# Patient Record
Sex: Female | Born: 2000 | Race: Black or African American | Hispanic: No | Marital: Single | Smoking: Former smoker
Health system: Southern US, Community
[De-identification: ages and names within clinical notes are randomized; demographics above are authoritative.]

---
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
## 2018-03-11 ENCOUNTER — Encounter (HOSPITAL_COMMUNITY): Payer: Self-pay | Admitting: *Deleted

## 2018-03-11 ENCOUNTER — Inpatient Hospital Stay (HOSPITAL_COMMUNITY)
Admission: AD | Admit: 2018-03-11 | Discharge: 2018-03-12 | Disposition: A | Discharge status: Home / Self Care | Payer: Medicaid Other | Source: Ambulatory Visit | Attending: Family Medicine | Admitting: Family Medicine

## 2018-03-11 DIAGNOSIS — Z5321 Procedure and treatment not carried out due to patient leaving prior to being seen by health care provider: Secondary | ICD-10-CM | POA: Insufficient documentation
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
## 2018-03-12 DIAGNOSIS — Z5321 Procedure and treatment not carried out due to patient leaving prior to being seen by health care provider: Secondary | ICD-10-CM | POA: Diagnosis not present

## 2018-03-12 NOTE — MAU Note (Signed)
NOT IN LOBBY 

## 2018-04-18 ENCOUNTER — Emergency Department (HOSPITAL_BASED_OUTPATIENT_CLINIC_OR_DEPARTMENT_OTHER)
Admission: EM | Admit: 2018-04-18 | Discharge: 2018-04-18 | Disposition: A | Discharge status: Home / Self Care | Payer: Medicaid Other | Attending: Emergency Medicine | Admitting: Emergency Medicine

## 2018-04-18 ENCOUNTER — Other Ambulatory Visit: Payer: Self-pay

## 2018-04-18 ENCOUNTER — Emergency Department (HOSPITAL_BASED_OUTPATIENT_CLINIC_OR_DEPARTMENT_OTHER): Payer: Medicaid Other

## 2018-04-18 ENCOUNTER — Encounter (HOSPITAL_BASED_OUTPATIENT_CLINIC_OR_DEPARTMENT_OTHER): Payer: Self-pay | Admitting: *Deleted

## 2018-04-18 DIAGNOSIS — S29019A Strain of muscle and tendon of unspecified wall of thorax, initial encounter: Secondary | ICD-10-CM | POA: Insufficient documentation

## 2018-04-18 DIAGNOSIS — Y939 Activity, unspecified: Secondary | ICD-10-CM | POA: Diagnosis not present

## 2018-04-18 DIAGNOSIS — Y999 Unspecified external cause status: Secondary | ICD-10-CM | POA: Diagnosis not present

## 2018-04-18 DIAGNOSIS — M25512 Pain in left shoulder: Secondary | ICD-10-CM | POA: Diagnosis not present

## 2018-04-18 DIAGNOSIS — F17200 Nicotine dependence, unspecified, uncomplicated: Secondary | ICD-10-CM | POA: Diagnosis not present

## 2018-04-18 DIAGNOSIS — S161XXA Strain of muscle, fascia and tendon at neck level, initial encounter: Secondary | ICD-10-CM

## 2018-04-18 DIAGNOSIS — Y929 Unspecified place or not applicable: Secondary | ICD-10-CM | POA: Insufficient documentation

## 2018-04-18 DIAGNOSIS — S199XXA Unspecified injury of neck, initial encounter: Secondary | ICD-10-CM | POA: Diagnosis present

## 2018-04-18 LAB — PREGNANCY, URINE: PREG TEST UR: NEGATIVE

## 2018-04-18 MED ORDER — IBUPROFEN 800 MG PO TABS: 800.0000 mg | ORAL_TABLET | Freq: Three times a day (TID) | ORAL | 0 refills | Status: AC | PRN

## 2018-04-18 NOTE — Discharge Instructions (Signed)
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
## 2021-05-10 ENCOUNTER — Ambulatory Visit
Admission: EM | Admit: 2021-05-10 | Discharge: 2021-05-10 | Disposition: A | Discharge status: Home / Self Care | Payer: Medicaid Other | Attending: Family Medicine | Admitting: Family Medicine

## 2021-05-10 ENCOUNTER — Ambulatory Visit: Payer: Self-pay

## 2021-05-10 ENCOUNTER — Other Ambulatory Visit: Payer: Self-pay

## 2021-05-10 DIAGNOSIS — R519 Headache, unspecified: Secondary | ICD-10-CM

## 2021-05-10 DIAGNOSIS — N926 Irregular menstruation, unspecified: Secondary | ICD-10-CM | POA: Diagnosis not present

## 2021-05-10 LAB — POCT URINE PREGNANCY: Preg Test, Ur: NEGATIVE

## 2021-05-10 MED ORDER — DEXAMETHASONE SODIUM PHOSPHATE 10 MG/ML IJ SOLN
10.0000 mg | Freq: Once | INTRAMUSCULAR | Status: AC
  Administered 2021-05-10: 10 mg via INTRAMUSCULAR

## 2021-05-10 MED ORDER — KETOROLAC TROMETHAMINE 30 MG/ML IJ SOLN
30.0000 mg | Freq: Once | INTRAMUSCULAR | Status: AC
  Administered 2021-05-10: 30 mg via INTRAMUSCULAR

## 2021-05-10 NOTE — ED Triage Notes (Signed)
Pt c/o headaches since Monday, states no OTC mediations help.  Pt reports not having a menstrual cycle for 41 days. Does not use any form of birth control, requesting to be tested for pregnancy. Pt also reports having lower back pain.

## 2021-05-10 NOTE — ED Provider Notes (Signed)
[facility]    CSN: [id number] Arrival date & time: 05/10/21  1122      History   Chief Complaint Chief Complaint  Patient presents with   Headache   Possible Pregnancy    HPI Katie Orozco is a 20 y.o. female.   Patient presenting today with a diffuse headache x2 days that has been constant.  She denies associated dizziness, vision changes, nausea, vomiting, congestion, sore throat, fever, chills, mental status changes, extremity weakness, numbness, tingling, recent head injury.  She has tried Excedrin Migraine and Tylenol with no relief.  Denies any history of headache disorders, migraines.  No new routine changes, new stressors, diet or medication changes.  She is also requesting a pregnancy test as her period is about 2 weeks late.  She has had unprotected intercourse and does not use any sort of contraception.  No vaginal bleeding, discharge or other GU symptoms.   History reviewed. No pertinent past medical history.  Patient Active Problem List   Diagnosis Date Noted   UTI (lower urinary tract infection) 11/29/2015    History reviewed. No pertinent surgical history.  OB History     Gravida  0   Para  0   Term  0   Preterm  0   AB  0   Living  0      SAB  0   IAB  0   Ectopic  0   Multiple  0   Live Births               Home Medications    Prior to Admission medications   Medication Sig Start Date End Date Taking? Authorizing Provider  ibuprofen (ADVIL,MOTRIN) 800 MG tablet Take 1 tablet (800 mg total) by mouth every 8 (eight) hours as needed. 04/18/18   Long, Arlyss Repress, MD  metroNIDAZOLE (FLAGYL) 500 MG tablet Take 1 tablet (500 mg total) by mouth 2 (two) times daily. 01/28/16   Earley Favor, NP  norgestimate-ethinyl estradiol (ORTHO-CYCLEN,SPRINTEC,PREVIFEM) 0.25-35 MG-MCG tablet Take 1 tablet by mouth daily. 11/25/15   Leftwich-Kirby, Wilmer Floor, CNM  sulfamethoxazole-trimethoprim (BACTRIM DS,SEPTRA DS) 800-160 MG tablet Take 1 tablet by  mouth 2 (two) times daily. 11/29/15   Aviva Signs, CNM    Family History History reviewed. No pertinent family history.  Social History Social History   Tobacco Use   Smoking status: Former    Types: Cigarettes   Smokeless tobacco: Never  Substance Use Topics   Alcohol use: Yes    Comment: socially   Drug use: No     Allergies   Patient has no known allergies.   Review of Systems Review of Systems Per HPI  Physical Exam Triage Vital Signs ED Triage Vitals  Enc Vitals Group     BP 05/10/21 1144 113/77     Pulse Rate 05/10/21 1144 83     Resp 05/10/21 1144 18     Temp 05/10/21 1144 98.4 F (36.9 C)     Temp Source 05/10/21 1144 Oral     SpO2 05/10/21 1144 97 %     Weight --      Height --      Head Circumference --      Peak Flow --      Pain Score 05/10/21 1142 8     Pain Loc --      Pain Edu? --      Excl. in GC? --    No data found.  Updated Vital  Signs BP 113/77 (BP Location: Left Arm)   Pulse 83   Temp 98.4 F (36.9 C) (Oral)   Resp 18   LMP  (LMP Unknown)   SpO2 97%   Visual Acuity Right Eye Distance:   Left Eye Distance:   Bilateral Distance:    Right Eye Near:   Left Eye Near:    Bilateral Near:     Physical Exam Vitals and nursing note reviewed.  Constitutional:      Appearance: Normal appearance. She is not ill-appearing.  HENT:     Head: Atraumatic.     Right Ear: Tympanic membrane normal.     Left Ear: Tympanic membrane normal.     Nose: Nose normal.     Mouth/Throat:     Mouth: Mucous membranes are moist.     Pharynx: Oropharynx is clear.  Eyes:     Extraocular Movements: Extraocular movements intact.     Conjunctiva/sclera: Conjunctivae normal.  Cardiovascular:     Rate and Rhythm: Normal rate and regular rhythm.     Heart sounds: Normal heart sounds.  Pulmonary:     Effort: Pulmonary effort is normal.     Breath sounds: Normal breath sounds.  Musculoskeletal:        General: Normal range of motion.      Cervical back: Normal range of motion and neck supple.  Skin:    General: Skin is warm and dry.  Neurological:     General: No focal deficit present.     Mental Status: She is alert and oriented to person, place, and time.     Motor: No weakness.     Gait: Gait normal.  Psychiatric:        Mood and Affect: Mood normal.        Thought Content: Thought content normal.        Judgment: Judgment normal.     UC Treatments / Results  Labs (all labs ordered are listed, but only abnormal results are displayed) Labs Reviewed  POCT URINE PREGNANCY    EKG   Radiology No results found.  Procedures Procedures (including critical care time)  Medications Ordered in UC Medications  ketorolac (TORADOL) 30 MG/ML injection 30 mg (30 mg Intramuscular Given 05/10/21 1245)  dexamethasone (DECADRON) injection 10 mg (10 mg Intramuscular Given 05/10/21 1247)    Initial Impression / Assessment and Plan / UC Course  I have reviewed the triage vital signs and the nursing notes.  Pertinent labs & imaging results that were available during my care of the patient were reviewed by me and considered in my medical decision making (see chart for details).     Vitals and exam benign and reassuring today, no focal neurologic deficits and in no acute distress.  Unclear etiology of her headache but will trial Toradol, Decadron and supportive home care.  Close follow-up with PCP recommended, return for worsening symptoms at any time.  Work note given for rest.  Urine pregnancy negative, follow-up with GYN for further evaluation and management of irregular menstrual cycles.  Final Clinical Impressions(s) / UC Diagnoses   Final diagnoses:  Bad headache  Missed period   Discharge Instructions   None    ED Prescriptions   None    PDMP not reviewed this encounter.   Particia Nearing, [place] 05/10/21 [phone]

## 2022-10-22 ENCOUNTER — Encounter: Payer: Self-pay | Admitting: Registered"

## 2022-10-22 ENCOUNTER — Encounter: Payer: Medicaid Other | Attending: Nurse Practitioner | Admitting: Registered"

## 2022-10-22 DIAGNOSIS — E282 Polycystic ovarian syndrome: Secondary | ICD-10-CM | POA: Insufficient documentation

## 2022-10-22 NOTE — Progress Notes (Signed)
Medical Nutrition Therapy  Appointment Start time:  [phone]  Appointment End time:  1015  Primary concerns today: cannot take Metformin, open to education how this visit can help her situation.  Referral diagnosis: PCOS Preferred learning style: no preference indicated Learning readiness: ready  NUTRITION ASSESSMENT  Anthropometrics  Not assessed this visit   Clinical Medical Hx: UTI, PCOS last Sept dx, dairy allergy Medications: reviewed Labs: A1c 5.3% Notable Signs/Symptoms: painful periods, hirsutism, irrgular periods, low energy, stress 5/10.  Lifestyle & Dietary Hx Pt reports although she was recently diagnosed with PCOS, believes she has had it for awhile. Pt reports her periods were unbearable, hirsutism on face and breasts, irrgular periods since ~22 yrs old. Pt reports her energy has decreased over the last year 3/10 and had to reduce from full-time work to part-time, works 6 am-12 or 2 pm. Pt states not enjoying her current job and wants to work in Therapist, art for [employer]. Pt states she is in school for computer science in December, on-line.    Pt states only diet she has tried is plant-based diet for a month, no meat. Pt states did not change her diet much except replaced meat with vegetarian substitutes.   Pt states she doesn't get much sun exposure and she has a weekly high-dose vitamin D Rx but has a hard time remembering to take it. .   Pt states started metformin last year and discontinued because diarrhea did not resolve. Pt states she started spironolactone, but discontinued due to low blood pressure. Pt reports sxs of feeling dizzy and vision affected -seeing stars & blurry vision.  Estimated daily fluid intake: 24 oz Supplements:probiotics, hair & nail vitamin Sleep: 10:30 pm - 5:30 a.m. naps 4-6 pm Stress / self-care: 5/10; nails, hair, shopping. 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  As evidenced by stated gain of new knowldege.  NUTRITION INTERVENTION  Nutrition education (E-1) on the following topics:  PCOS pathophysiology Insulin role in energy  Insulin resistance / elevated insulin levels Foods to help with inflammation Supplements: Vitamin D, inositol  Handouts Provided Include  Inositol and PCOS MyPlate planner ([place])  Learning Style & Readiness for Change Teaching method utilized: Visual & Auditory  Demonstrated degree of understanding via: Teach Back  Barriers to learning/adherence to lifestyle change: none  Goals Established by Pt Spearmint tea Sleep: Try changing your nap from 4-6 to 4-5; Go to bed at 10 pm (work toward 9:30). Alert on health app to take vitamin D weekly. Consider eating seafood start with twice a week, 3 times if desired. Pay attention to when you start craving exercise. Inositol supplement may be able to help insulin sensitivity  MONITORING & EVALUATION Dietary intake, weekly physical activity, and PCOS sxs in 6 weeks.

## 2022-10-22 NOTE — Patient Instructions (Addendum)
Spearmint tea  Sleep: Try changing your nap from 4-6 to 4-5; Go to bed at 10 pm (work toward 9:30).  Alert on health app to take vitamin D weekly.  Consider eating seafood start with twice a week, 3 times if desired.  Pay attention to when you start craving exercise.  Inositol supplement may be able to help insulin sensitivity

## 2022-12-17 ENCOUNTER — Encounter: Payer: Medicaid Other | Attending: Nurse Practitioner | Admitting: Registered"

## 2022-12-17 DIAGNOSIS — E282 Polycystic ovarian syndrome: Secondary | ICD-10-CM | POA: Insufficient documentation

## 2022-12-17 NOTE — Patient Instructions (Addendum)
Stop taking Fenugreek - it may increase testosterone levels. Start diluting your juice to help prevent potential sugar spikes and drops (low blood sugar). It may also be contributing to UTI symptoms if your urine has sugar in it. When having low blood sugar sxs check blood sugar and eat a piece of fruit if you feel bad. Spearmint tea is a good PCOS option for a beverage  To address inflammation that occurs in most people with PCOS: Try having fish once week (salmon 1x week), include more walnuts in your diet and consider taking an omega 3 supplement. This link has a very detailed description of omega 3 supplements and research behind it. If you scroll about 1/2 way down this very long page you will see the bottom line - the top picks based on quality and price and a few other ratings. [employer]  Keep a PCOS and hypoglycemia symptom diary and watch for subtle improvements.

## 2022-12-17 NOTE — Progress Notes (Signed)
Medical Nutrition Therapy follow-up Appointment Start time:  [phone]  Appointment End time:  0845  Primary concerns today: cannot take Metformin, open to education how this visit can help her situation.  Referral diagnosis: PCOS Preferred learning style: no preference indicated Learning readiness: ready  NUTRITION ASSESSMENT  Anthropometrics  Not assessed this visit   Clinical Medical Hx: UTI, PCOS last Sept dx, dairy allergy Medications: reviewed Labs: A1c 5.3% Notable Signs/Symptoms: painful periods, hirsutism, irrgular periods, low energy, stress 5/10.  Lifestyle & Dietary Hx Pt reports her sleep is improved and feeling a little more rested.  Pt states she has been remembering to take her weekly vitamin D Rx by putting it in her pill organizer. Pt states she has started taking an inositol supplement x20 days, hasn't noticed much difference yet. (May take up to 3 months)  Pt states she was having low-blood sugar sxs and went to [facility] last Sept and started her on metformin, but states she only took for a week d/t side effects. Pt reports doctor suggested to drink 8 glasses of water per day, which she did but still had hypoglycemic sxs. Pt states her most recent episode was a month ago.  Pt states she drinks cranberry juice daily to prevent UTI sxs. Pt states she notices that when she drinks sweet tea she gets UTI.   Estimated daily fluid intake: 24 oz (not assessed this visit) Supplements: cranberry, apple cider vinegar, probiotics, hair & nail vitamin Sleep: 7 hrs. At night naps 8-9 pm bed ~10 pm Stress / self-care: 5/10; nails, hair, shopping (not assessed) Current average weekly physical activity: ADL (not assessed)  24-Hr Dietary Recall First Meal:  Snack:  Second Meal:  Snack:  Third Meal:  Snack:  Beverages: water, 8 oz cranberry juice, orange juice occasionally, sweet tea 1x/month when getting fast food.  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  As evidenced by stated gain of new knowldege.  NUTRITION INTERVENTION  Nutrition education (E-1) on the following topics:  Hypoglycemia causes UTI causes Purpose of the supplements patient is taking: Cranberry, fenugreek, added ingredients to her Inositol supplement including Omega 3, zinc, 1000 u Vit D (did not have time to talk about apple cider vinegar)  Handouts Provided Include  none  Learning Style & Readiness for Change Teaching method utilized: Visual & Auditory  Demonstrated degree of understanding via: Teach Back  Barriers to learning/adherence to lifestyle change: none  Goals Established by Pt Consider eating seafood start with twice a week, 3 times if desired. Pay attention to when you start craving exercise. Inositol supplement may be able to help insulin sensitivity  MONITORING & EVALUATION Dietary intake, weekly physical activity, and PCOS sxs in 6 weeks.

## 2023-02-18 ENCOUNTER — Ambulatory Visit: Payer: Medicaid Other | Admitting: Registered"

## 2023-05-22 ENCOUNTER — Ambulatory Visit: Payer: Self-pay | Admitting: Family Medicine

## 2023-05-22 DIAGNOSIS — Z0000 Encounter for general adult medical examination without abnormal findings: Secondary | ICD-10-CM

## 2023-06-03 ENCOUNTER — Ambulatory Visit: Payer: Self-pay | Admitting: Family Medicine

## 2024-05-25 ENCOUNTER — Ambulatory Visit (INDEPENDENT_AMBULATORY_CARE_PROVIDER_SITE_OTHER): Admitting: Internal Medicine

## 2024-05-25 ENCOUNTER — Encounter: Payer: Self-pay | Admitting: Internal Medicine

## 2024-05-25 VITALS — BP 108/60 | HR 80 | Temp 98.2°F | Resp 20 | Ht 63.0 in | Wt 193.9 lb

## 2024-05-25 DIAGNOSIS — T781XXD Other adverse food reactions, not elsewhere classified, subsequent encounter: Secondary | ICD-10-CM

## 2024-05-25 DIAGNOSIS — L500 Allergic urticaria: Secondary | ICD-10-CM | POA: Diagnosis not present

## 2024-05-25 DIAGNOSIS — T781XXA Other adverse food reactions, not elsewhere classified, initial encounter: Secondary | ICD-10-CM

## 2024-05-25 NOTE — Patient Instructions (Signed)
 Chronic spontaneous urticaria Recurrent hives monthly, primarily post-Asian food consumption. Symptoms resolve with EpiPen. No systemic symptoms. Suspected chronic spontaneous urticaria, exacerbated by exercise and scratching. Differential includes food allergy, but chronic spontaneous urticaria is more likely. - Order labs for chronic hives markers. - Advise taking pictures of hives for evaluation. - Recommend trial of Zyrtec before EpiPen for skin only symptoms  - Discuss potential Xolair use if chronic hives confirmed.  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 Previous testing indicated dairy allergy. Symptoms with Asian food suggest cross-reactivity or misdiagnosis. Possible cross-contamination or undetected food reactions. - Repeat food allergy testing (1-72)  - Advise strict avoidance of cow's milk and dairy, including baked milk for now  - Provide allergy action plan. - Continue to carry epipen and follow food allergy action plan for any reactions  - Instruct to avoid antihistamines three days before testing.  Recording duration: 16 minutes  Follow up: Monday 06/01/24 at 11:00 am, hold all antihistamines for 3 days prior

## 2024-05-25 NOTE — Progress Notes (Signed)
 "  NEW PATIENT Date of Service/Encounter:  05/25/24 Referring provider: Sharyne Harlene CROME, NP Primary care provider: Sharyne Harlene CROME, NP  Subjective:  Katie Orozco is a 23 y.o. female  presenting today for evaluation of food reaction  History obtained from: chart review and patient.   Discussed the use of AI scribe software for clinical note transcription with the patient, who gave verbal consent to proceed.  History of Present Illness Katie Orozco is a 23 year old female who presents with recurrent hives and suspected dairy allergy .  Urticaria and cutaneous reactions - Recurrent hives for the past two years, primarily associated with dairy ingestion - Hives occur approximately once per month - Hives are accompanied by 'black spots' on chest and neck, appearing within one hour of dairy exposure - Resolution of symptoms within 15 minutes after use of EpiPen - No associated diarrhea, vomiting, or respiratory symptoms - Welting occurs with scratching of the skin - No documentation of hives with photographs  Food-related hypersensitivity - Hives triggered by cow's milk and dairy products, despite avoidance efforts - Accidental exposures occur, particularly at restaurants, even after informing staff of milk allergy  - Similar reactions to Asian foods such as pho and orange chicken, with symptoms developing within one to two hours of consumption, including when prepared at home without dairy  Other triggers  - Exercise causes swelling and pruritus of the fingers - hot water causes itch and redness   Medication and environmental triggers - No reactions to aspirin, ibuprofen , alcohol, or stress - No known allergies to other foods, medications, or environmental allergens - No history of asthma  Therapeutic interventions - Uses EpiPen for acute reactions, with rapid symptom resolution - Has not tried antihistamines such as Zyrtec or Benadryl prior to EpiPen  use      Other allergy  screening: Asthma: no Rhino conjunctivitis: no Food allergy : yes Medication allergy : no Hymenoptera allergy : no Urticaria: yes Eczema:no History of recurrent infections suggestive of immunodeficency: no Vaccinations are up to date.   Past Medical History: History reviewed. No pertinent past medical history. Medication List:  Current Outpatient Medications  Medication Sig Dispense Refill   APPLE CIDER VINEGAR PO Take by mouth. (Patient not taking: Reported on 05/25/2024)     CRANBERRY EXTRACT PO Take 500 mg by mouth as needed (for UTI sxs). (Patient not taking: Reported on 05/25/2024)     ibuprofen  (ADVIL ,MOTRIN ) 800 MG tablet Take 1 tablet (800 mg total) by mouth every 8 (eight) hours as needed. (Patient not taking: Reported on 05/25/2024) 21 tablet 0   INOSITOL PO Take by mouth. (Patient not taking: Reported on 05/25/2024)     metFORMIN (GLUCOPHAGE) 500 MG tablet Take 1 tablet every day by oral route. (Patient not taking: Reported on 05/25/2024)     norgestimate -ethinyl estradiol  (ORTHO-CYCLEN,SPRINTEC,PREVIFEM) 0.25-35 MG-MCG tablet Take 1 tablet by mouth daily. (Patient not taking: Reported on 05/25/2024) 1 Package 11   No current facility-administered medications for this visit.   Known Allergies:  No Known Allergies Past Surgical History: History reviewed. No pertinent surgical history. Family History: Family History  Problem Relation Age of Onset   Allergic rhinitis Neg Hx    Angioedema Neg Hx    Asthma Neg Hx    Atopy Neg Hx    Eczema Neg Hx    Immunodeficiency Neg Hx    Urticaria Neg Hx    Social History: [place] lives single-family home this 25 years old.  No water damage.  Electric heating central cooling.  No roaches in the house and bed is to get off floor.  No dust mite precautions.  Cigarette exposure in the car but not the house.  Works for customer service rep  ROS:  All other systems negative except as noted per HPI.  Objective:   Blood pressure 108/60, pulse 80, temperature 98.2 F (36.8 C), temperature source Temporal, resp. rate 20, height 5' 3 (1.6 m), weight 193 lb 14.4 oz (88 kg), SpO2 99%. Body mass index is 34.35 kg/m. Physical Exam:  General Appearance:  Alert, cooperative, no distress, appears stated age  Head:  Normocephalic, without obvious abnormality, atraumatic  Eyes:  Conjunctiva clear, EOM's intact  Ears EACs normal bilaterally  Nose: Nares normal, normal mucosa, no visible anterior polyps, and septum midline  Throat: Lips, tongue normal; teeth and gums normal, normal posterior oropharynx  Neck: Supple, symmetrical  Lungs:   clear to auscultation bilaterally, Respirations unlabored, no coughing  Heart:  regular rate and rhythm and no murmur, Appears well perfused  Extremities: No edema  Skin: Skin color, texture, turgor normal and no rashes or lesions on visualized portions of skin  Neurologic: No gross deficits   Diagnostics: None done    Labs:  Lab Orders         Chronic Urticaria (CU) Eval         IgE         Tryptase         IgE Milk w/ Component Reflex       Assessment and Plan  Assessment and Plan Assessment & Plan Chronic spontaneous urticaria Recurrent hives monthly, primarily post-Asian food consumption. Symptoms resolve with EpiPen. No systemic symptoms. Suspected chronic spontaneous urticaria, exacerbated by exercise and scratching. Differential includes food allergy , but chronic spontaneous urticaria is more likely. - Order labs for chronic hives markers. - Advise taking pictures of hives for evaluation. - Recommend trial of Zyrtec before EpiPen for skin only symptoms  - Discuss potential Xolair use if chronic hives confirmed.  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 Previous testing indicated dairy allergy . Symptoms with Asian food suggest cross-reactivity or misdiagnosis. Possible cross-contamination or undetected food reactions. -  Repeat food allergy  testing (1-72)  - Advise strict avoidance of cow's milk and dairy, including baked milk for now  - Provide allergy  action plan. - Continue to carry epipen and follow food allergy  action plan for any reactions  - Instruct to avoid antihistamines three days before testing.  Recording duration: 16 minutes  Follow up: Monday 06/01/24 at 11:00 am, hold all antihistamines for 3 days prior   This note in its entirety was forwarded to the Provider who requested this consultation.  Other: information on xolair given   Thank you for your kind referral. I appreciate the opportunity to take part in Horace's care. Please do not hesitate to contact me with questions.  Sincerely,  Thank you so much for letting me partake in your care today.  Don't hesitate to reach out if you have any additional concerns!  Hargis Springer, MD  [facility] [place] [place]       "

## 2024-05-29 LAB — CHRONIC URTICARIA (CU) EVAL
ALT: 14 IU/L (ref 0–32)
Basophils Absolute: 0 x10E3/uL (ref 0.0–0.2)
Basos: 1 %
CRP: 6 mg/L (ref 0–10)
EOS (ABSOLUTE): 0.1 x10E3/uL (ref 0.0–0.4)
Eos: 2 %
Hematocrit: 43.1 % (ref 34.0–46.6)
Hemoglobin: 14.5 g/dL (ref 11.1–15.9)
Immature Grans (Abs): 0 x10E3/uL (ref 0.0–0.1)
Immature Granulocytes: 0 %
Lymphocytes Absolute: 1.4 x10E3/uL (ref 0.7–3.1)
Lymphs: 19 %
MCH: 30.5 pg (ref 26.6–33.0)
MCHC: 33.6 g/dL (ref 31.5–35.7)
MCV: 91 fL (ref 79–97)
Monocytes Absolute: 0.6 x10E3/uL (ref 0.1–0.9)
Monocytes: 8 %
Neutrophils Absolute: 4.9 x10E3/uL (ref 1.4–7.0)
Neutrophils: 70 %
Platelets: 235 x10E3/uL (ref 150–450)
Pooled Donor- BAT CU: 7.79 % (ref 0.00–10.60)
RBC: 4.76 x10E6/uL (ref 3.77–5.28)
RDW: 12.8 % (ref 11.7–15.4)
Sed Rate: 29 mm/h (ref 0–32)
TSH: 1.05 u[IU]/mL (ref 0.450–4.500)
Thyroperoxidase Ab SerPl-aCnc: 13 [IU]/mL (ref 0–34)
WBC: 7 x10E3/uL (ref 3.4–10.8)

## 2024-05-29 LAB — IGE MILK W/ COMPONENT REFLEX: F002-IgE Milk: <0.1 kU/L

## 2024-05-29 LAB — IGE: IgE (Immunoglobulin E), Serum: 86 [IU]/mL (ref 6–495)

## 2024-05-29 LAB — TRYPTASE: Tryptase: 4.5 ug/L (ref 2.2–13.2)

## 2024-06-01 ENCOUNTER — Ambulatory Visit: Admitting: Internal Medicine

## 2024-06-01 DIAGNOSIS — L500 Allergic urticaria: Secondary | ICD-10-CM

## 2024-06-01 NOTE — Patient Instructions (Addendum)
 Chronic spontaneous urticaria Recurrent hives monthly, primarily post-Asian food consumption. Symptoms resolve with EpiPen. No systemic symptoms. Suspected chronic spontaneous urticaria, exacerbated by exercise and scratching. Differential includes food allergy, but chronic spontaneous urticaria is more likely. 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 Previous testing indicated dairy allergy. Symptoms with Asian food suggest cross-reactivity or misdiagnosis. Possible cross-contamination or undetected food reactions. - Advise strict avoidance of cow's milk and dairy, including baked milk for now  - Continue to carry epipen and follow food allergy action plan for any reactions  - Allergy test (06/01/2024) positive to sesame, lamb, tomato, white potato, sweet potato, pea, cabbage  - Review previous recipes that you have reacted to if one of the above foods is in that recipe start strictly avoiding  Follow up: 6 months

## 2024-06-01 NOTE — Progress Notes (Signed)
 Date of Service/Encounter:  06/01/24  Allergy testing appointment   Initial visit on 05/25/24, seen for concern for food allergies .  Please see that note for additional details.  Today reports for allergy diagnostic testing:    DIAGNOSTICS:  Skin Testing: Food allergy panel. Adequate positive and negative controls, some dermatographia some Results discussed with patient/family.  Food Adult Perc - 06/01/24 1000     Time Antigen Placed 1057    Allergen Manufacturer Jestine    Location Back    Number of allergen test 72     Control-buffer 50% Glycerol 2+    Control-Histamine 4+    1. Peanut Negative    2. Soybean Negative    3. Wheat Negative    4. Sesame 2+    6. Casein Negative    7. Egg White, Chicken Negative    8. Shellfish Mix Negative    9. Fish Mix Negative    10. Cashew Negative    11. Walnut Food Negative    12. Almond Negative    13. Hazelnut Negative    14. Pecan Food Negative    15. Pistachio Negative    16. [place] Nut Negative    17. Coconut Negative    18. Trout Negative    19. Tuna Negative    20. Salmon Negative    21. Flounder Negative    22. Codfish Negative    23. Shrimp Negative    24. Crab Negative    25. Lobster Negative    26. Oyster Negative    27. Scallops Negative    28. Oat  Negative    29. Rice Negative    30. Barley Negative    31. Rye  Negative    32. Hops Negative    33. [place] Meat Negative    34. Chicken Meat Negative    36. Beef Negative    37. Lamb 2+    38. Tomato 2+    39. White Potato 2+    40. Sweet Potato 2+    41. Pea, Green/English 2+    42. Navy Bean Negative    43. Green Beans Negative    44. Squash Negative    45. Green Pepper Negative    46. Mushrooms Negative    47. Onion Negative    48. Avocado Negative    49. Cabbage 2+    50. Carrots Negative    51. Celery Negative    52. Corn Negative    53. Cucumber Negative    54. Grape (White seedless) Negative    55. Orange  Negative    56. Lemon Negative     57. Banana Negative    58. Apple Negative    59. Peach Negative    60. Strawberry Negative    61. Blueberry Negative    62. Cherry Negative    63. Cantaloupe Negative    64. Watermelon Negative    65. Pineapple Negative    66. Chocolate/Cacao Bean Negative    67. Cinnamon Negative    68. Nutmeg Negative    69. Ginger Negative    70. Garlic Negative    71. Pepper, Black Negative    72. Mustard Negative          Allergy testing results were read and interpreted by myself, documented by clinical staff.  Patient provided with copy of allergy testing along with avoidance measures when indicated.   Hargis Springer, MD  [facility] 

## 2024-06-25 ENCOUNTER — Telehealth: Payer: Self-pay | Admitting: Internal Medicine

## 2024-06-25 NOTE — Telephone Encounter (Signed)
 Called left message for patient with information on request for cetirizine 10 mg my need a prior auth for prescription but she can purchase otc.

## 2024-06-25 NOTE — Telephone Encounter (Signed)
 Katie Orozco called and stated that at her last appt she was to have been prescribed cetirizine 10 mg and that it was not sent in. She uses the [facility] on [address] in [place]

## 2024-07-13 ENCOUNTER — Telehealth: Payer: Self-pay

## 2024-07-13 NOTE — Telephone Encounter (Signed)
 Received [facility] clarification for diagnosis patient of Dr Lorin date of service 05/25/2024. Prvided code TL50.0 verified and added [id number]. faxed back to [phone] and sent to scan center.
